# Patient Record
Sex: Male | Born: 1940 | Race: White | Hispanic: No | Marital: Married | Smoking: Current every day smoker
Health system: Southern US, Community
[De-identification: ages and names within clinical notes are randomized; demographics above are authoritative.]

---

## 2010-11-09 ENCOUNTER — Emergency Department (HOSPITAL_COMMUNITY): Payer: Medicare HMO

## 2010-11-09 ENCOUNTER — Emergency Department (HOSPITAL_COMMUNITY)
Admission: EM | Admit: 2010-11-09 | Discharge: 2010-11-09 | Disposition: A | Discharge status: Home / Self Care | Payer: Medicare HMO | Attending: Emergency Medicine | Admitting: Emergency Medicine

## 2010-11-09 ENCOUNTER — Encounter: Payer: Self-pay | Admitting: Emergency Medicine

## 2010-11-09 DIAGNOSIS — F17200 Nicotine dependence, unspecified, uncomplicated: Secondary | ICD-10-CM | POA: Insufficient documentation

## 2010-11-09 DIAGNOSIS — W010XXA Fall on same level from slipping, tripping and stumbling without subsequent striking against object, initial encounter: Secondary | ICD-10-CM | POA: Insufficient documentation

## 2010-11-09 DIAGNOSIS — R52 Pain, unspecified: Secondary | ICD-10-CM | POA: Insufficient documentation

## 2010-11-09 DIAGNOSIS — M25469 Effusion, unspecified knee: Secondary | ICD-10-CM | POA: Insufficient documentation

## 2010-11-09 DIAGNOSIS — M25569 Pain in unspecified knee: Secondary | ICD-10-CM

## 2010-11-09 MED ORDER — IBUPROFEN 800 MG PO TABS: 800.0000 mg | ORAL_TABLET | Freq: Three times a day (TID) | ORAL | Status: AC

## 2010-11-09 MED ORDER — HYDROCODONE-ACETAMINOPHEN 5-325 MG PO TABS: 1.0000 | ORAL_TABLET | ORAL | Status: AC | PRN

## 2010-11-09 MED ORDER — HYDROCODONE-ACETAMINOPHEN 5-325 MG PO TABS
2.0000 | ORAL_TABLET | Freq: Once | ORAL | Status: AC
  Administered 2010-11-09: 2 via ORAL
  Filled 2010-11-09: qty 2

## 2010-11-09 MED ORDER — IBUPROFEN 800 MG PO TABS
800.0000 mg | ORAL_TABLET | Freq: Once | ORAL | Status: AC
  Administered 2010-11-09: 800 mg via ORAL
  Filled 2010-11-09: qty 1

## 2010-11-09 NOTE — ED Provider Notes (Signed)
History     CSN: [id number] Arrival date & time: 11/09/2010  5:28 AM  Chief Complaint  Patient presents with  . Fall  . Knee Pain   HPI Comments: Seen [id number]  Patient is a 70 y.o. male presenting with fall and knee pain. The history is provided by the patient.  Fall The accident occurred less than 1 hour ago. He fell from a height of 3 to 5 ft. 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 The point of impact was the left knee. The pain is present in the left knee. The pain is at a severity of 7/10. The pain is moderate. He was not ambulatory at the scene. There was no entrapment after the fall. There was no drug use involved in the accident. There was no alcohol use involved in the accident. The symptoms are aggravated by standing and ambulation. He has tried nothing for the symptoms.  Knee Pain    History reviewed. No pertinent past medical history.  History reviewed. No pertinent past surgical history.  No family history on file.  History  Substance Use Topics  . Smoking status: Current Everyday Smoker -- 1.0 packs/day    Types: Cigarettes  . Smokeless tobacco: Not on file  . Alcohol Use: No      Review of Systems  All other systems reviewed and are negative.    Physical Exam  BP 122/61  Pulse 73  Temp(Src) 98.5 F (36.9 C) (Oral)  Resp 18  Ht 5\' 8"  (1.727 m)  Wt 170 lb (77.111 kg)  BMI 25.85 kg/m2  SpO2 97%  Physical Exam  Constitutional: He is oriented to person, place, and time. He appears well-developed and well-nourished. He appears distressed.  HENT:  Head: Normocephalic and atraumatic.  Eyes: EOM are normal.  Neck: Normal range of motion. Neck supple.  Cardiovascular: Normal rate, normal heart sounds and intact distal pulses.   Pulmonary/Chest: Effort normal and breath sounds normal.  Abdominal: Soft.  Musculoskeletal:       Left knee with moderate effusion, positive crepitus. ROM with tenderness,  no deformity noted.pulses 2+  Neurological: He is alert and oriented to person, place, and time.  Skin: Skin is warm and dry.    ED Course  Procedures  MDM Patient who fell going down a hill due to rain slick grass. No fracture on xray. Suspect internal derangement of the knee. Placed in knee immobilizer, on crutches. Treated with analgesics. Home with Rx for antiinflammatory and analgesics. Referral to orthopedics for follow up.      Nicoletta Dress. Colon Branch, MD 11/09/10 [phone]

## 2010-11-09 NOTE — ED Notes (Signed)
Patient taught about crutch walking, demonstrated correctly. Knee immobilizer applied, wife and patient verbalized understanding of application. Patient given ice pack and instructed to use it in intervals of 20 minutes with elevation of left leg, patient verbalized understanding.

## 2010-11-09 NOTE — ED Notes (Signed)
Patient states he was walking down a hill and slipped on the grass; c/o left knee pain.

## 2012-04-25 IMAGING — CR DG KNEE COMPLETE 4+V*L*
5 series · 5 of 5 positions shown · non-contrast
Comparison: None.

CLINICAL DATA: Left knee pain after fall

LEFT KNEE - COMPLETE 4+ VIEW

[view not recorded (1 of 5)]
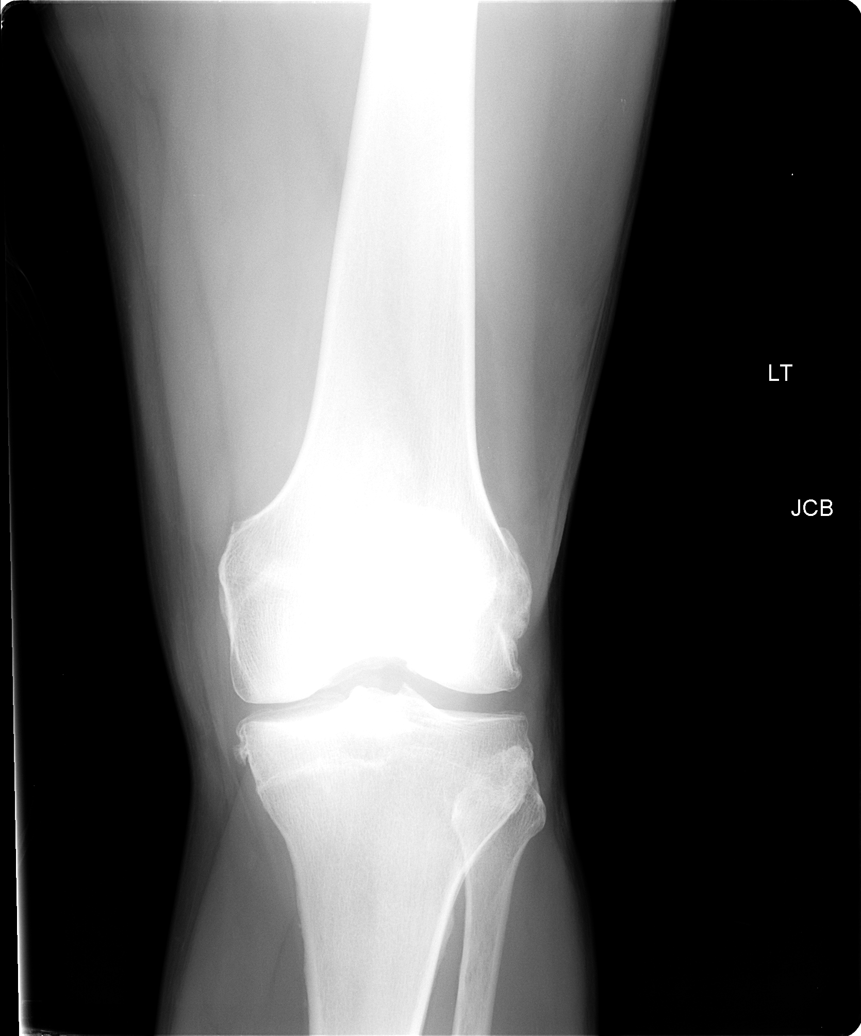

[view not recorded (2 of 5)]
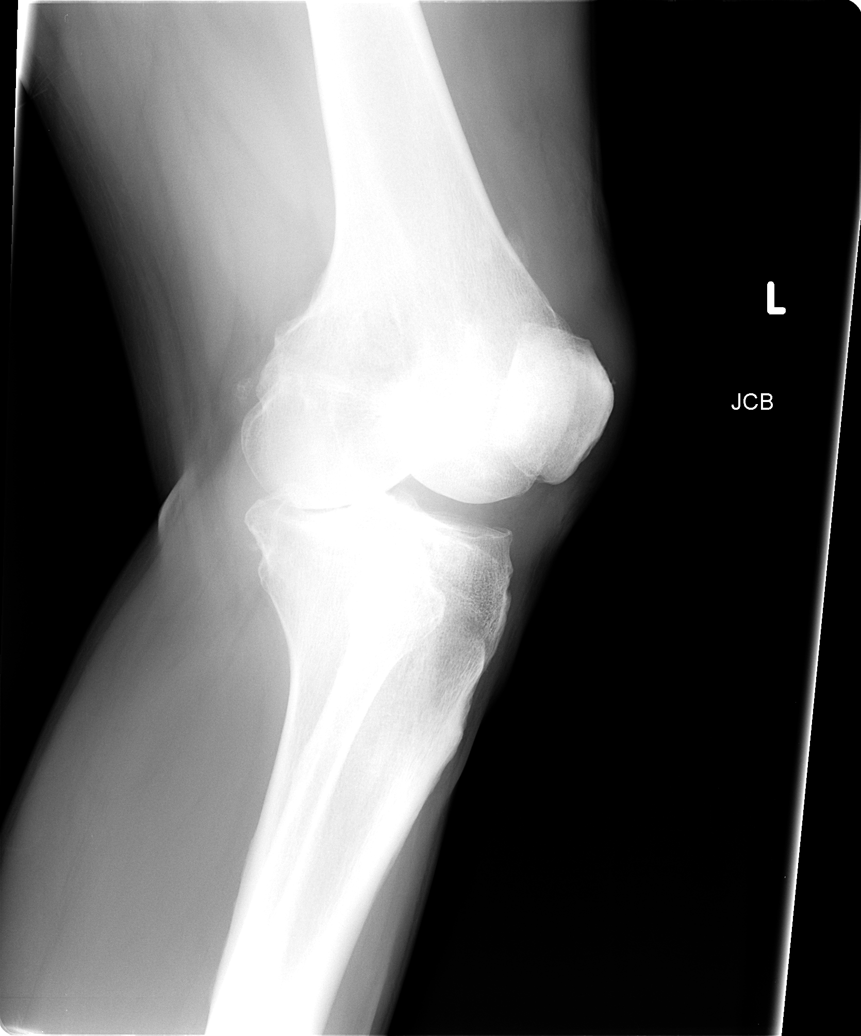

[view not recorded (3 of 5)]
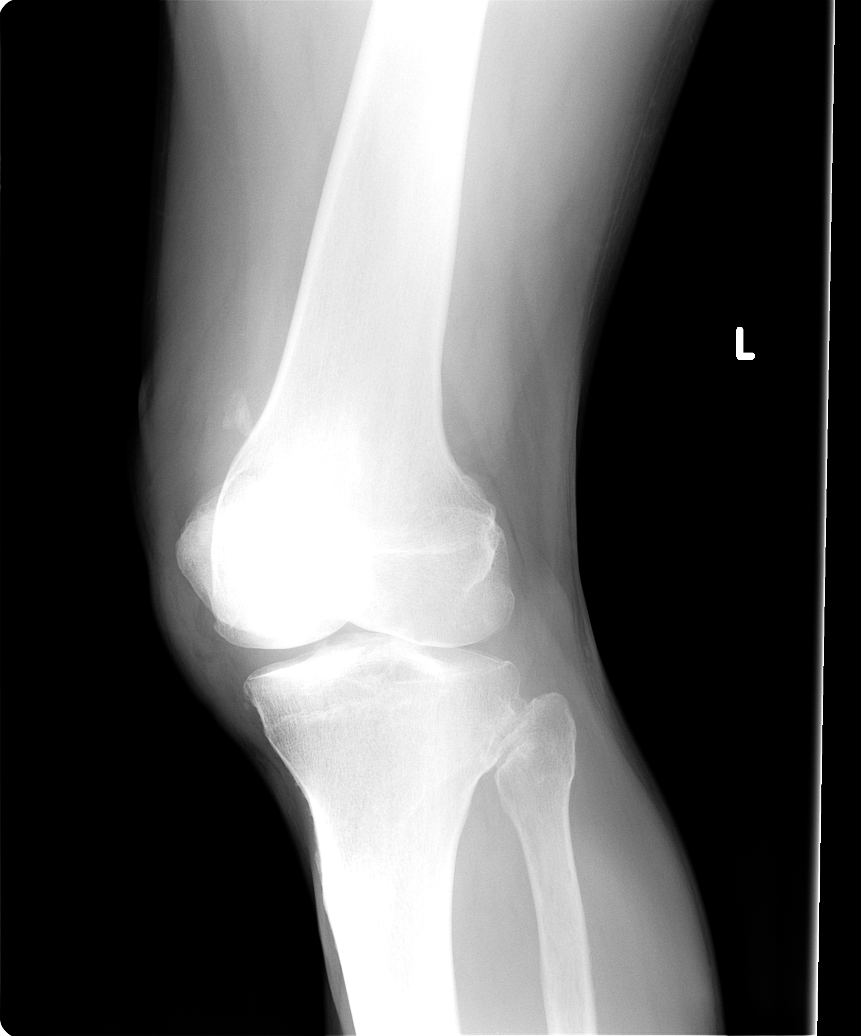

[view not recorded (4 of 5)]
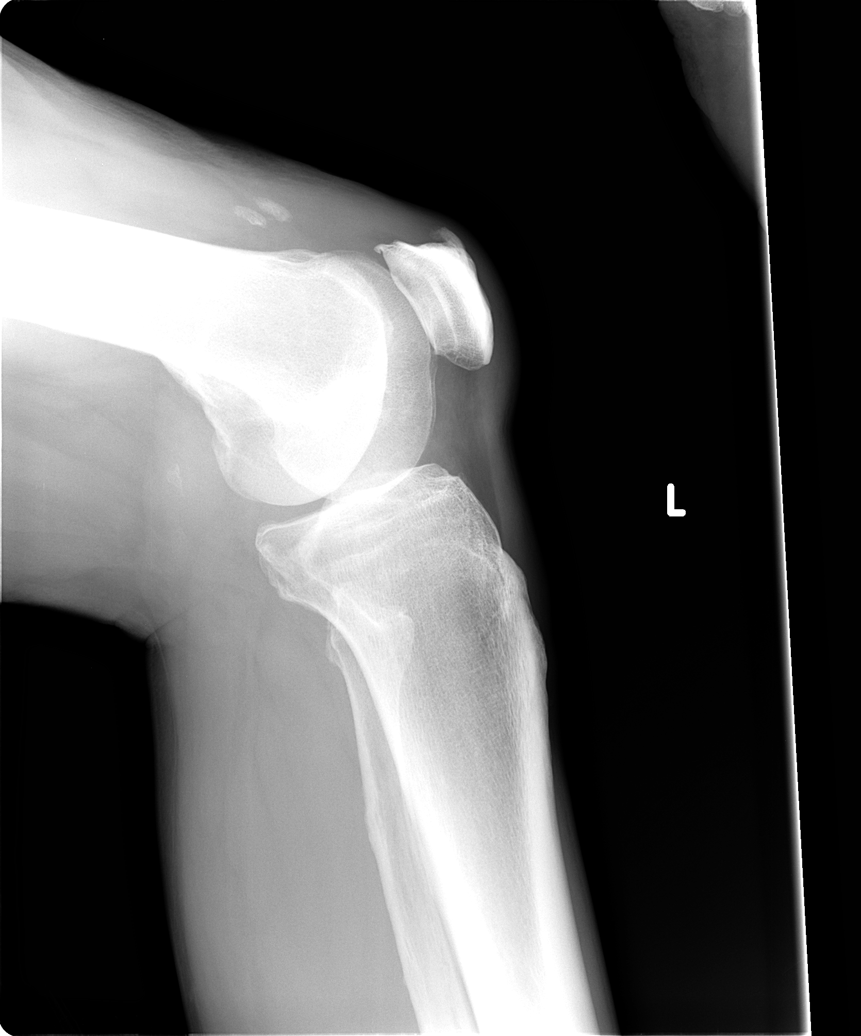

[view not recorded (5 of 5)]
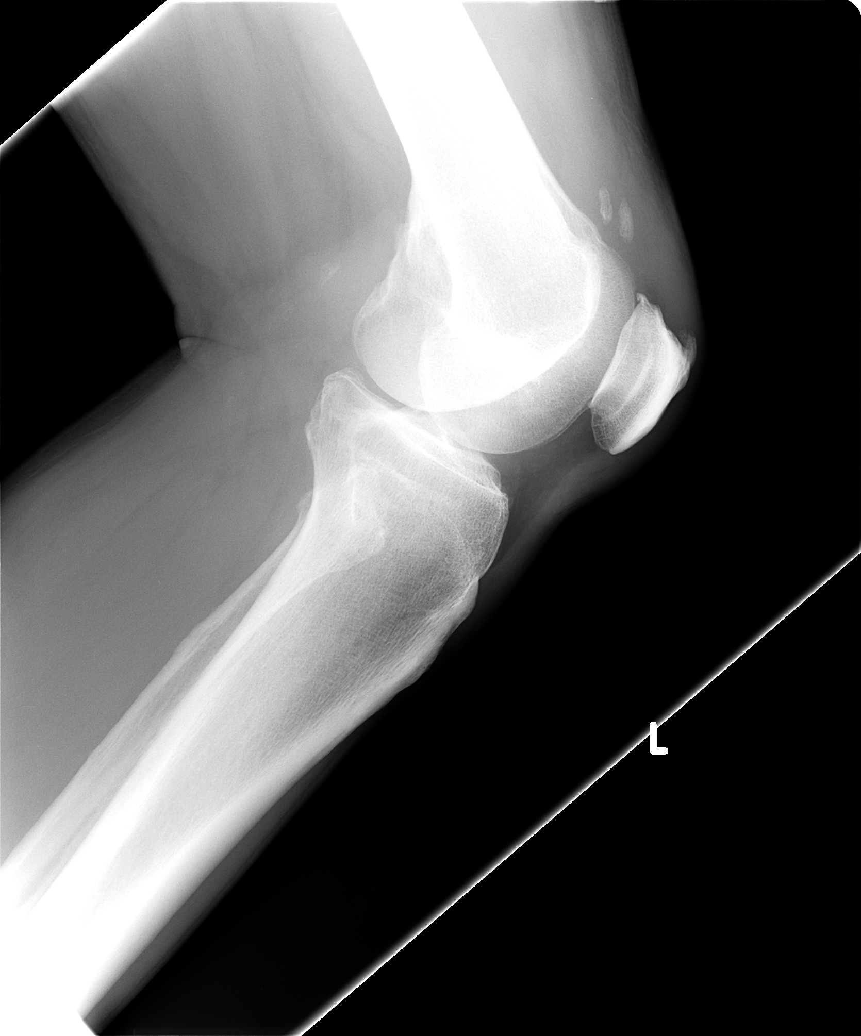

[5 of 5 positions shown; findings below may reference images not displayed]

FINDINGS: Degenerative changes with medial compartment narrowing
and hypertrophic changes in the medial and patellofemoral
compartments.  Calcifications in the suprapatellar spaces may
represent loose bodies or osteochondromatosis.  Probable small
effusion. No evidence of acute fracture or subluxation.  No focal
bone lesions.  Bone matrix and cortex appear intact.  No abnormal
radiopaque densities in the soft tissues.
IMPRESSION: Degenerative changes in the left knee.  Loose bodies versus
osteochondromatosis in the suprapatellar bursa.  No evidence of
acute fracture or subluxation.

## 2020-01-10 ENCOUNTER — Ambulatory Visit: Payer: Medicare HMO | Admitting: Dermatology

## 2020-01-10 ENCOUNTER — Encounter: Payer: Self-pay | Admitting: Dermatology

## 2020-01-10 ENCOUNTER — Other Ambulatory Visit: Payer: Self-pay

## 2020-01-10 DIAGNOSIS — L298 Other pruritus: Secondary | ICD-10-CM | POA: Diagnosis not present

## 2020-01-10 DIAGNOSIS — D692 Other nonthrombocytopenic purpura: Secondary | ICD-10-CM

## 2020-01-10 DIAGNOSIS — D1801 Hemangioma of skin and subcutaneous tissue: Secondary | ICD-10-CM

## 2020-01-10 DIAGNOSIS — Z1283 Encounter for screening for malignant neoplasm of skin: Secondary | ICD-10-CM

## 2020-01-10 DIAGNOSIS — L570 Actinic keratosis: Secondary | ICD-10-CM

## 2020-01-10 NOTE — Patient Instructions (Addendum)
First visit in many years for Michael Anderson date of birth Jan 28, 1941.  General skin check from the waist up showed no atypical moles, melanoma, or nonmobile skin cancer.  On the left lower outer front thigh and on the right forearm are brown subtly textured spots called stucco keratoses which are safe to leave.  On the torso you have a few dozen tiny little red dots called angiomas which require no intervention.  On the left inner buttocks is a roughly 2 inch area where the skin is discolored and thickened.  This represents irritation and pressure rather than a growth or an infection or a skin cancer.  Although currently this is not bothering you, this can lead to a little ulceration or something called a pressure sore.  Whatever you can do to not put pressure on this area when you are sitting for an extended period of time would be helpful.  Daily after bathing you can apply a very thin film of an ointment called Aquaphor for the next 30 days.  This can be gotten over-the-counter in a grocery store or [place] or on [employer].  It has no side effects.  I would appreciate a phone call in a month to tell me whether this has improved the area.  The additional spots I looked at were several skin tags under the arm which are quite harmless.  Finally we discussed the easy bruising he and his brothers have been experiencing on the forearms (no history of any other abnormal bleeding or bruising).  You may look for a nonprescription cream called Dermend and apply this to areas prone to bruise once daily.  Roughly 60% of people who apply this cream believe that they have significantly less bruising, but it is not a true cure.  Follow-up visit can be on an as-needed basis.  After the visit, [name] inform me that he has been plagued for years with extremely dry skin particularly in the winter.  He may look for a nonprescription moisturizer called Curel (if not available at [facility] then he may check on [place]  This works  best after soaking the skin (safe tub bath is more effective than a shower) it takes roughly 3 weeks to see improvement and then you be able to may be able to use this only a couple of times a week.

## 2020-04-03 ENCOUNTER — Encounter: Payer: Self-pay | Admitting: Dermatology

## 2020-04-03 NOTE — Progress Notes (Signed)
   New Patient   Subjective  Michael Anderson is a 80 y.o. male who presents for the following: Skin Problem (left post thigh/buttock- spot/dark- growing).  General skin examination Location:  Duration:  Quality: Wife noticed dark spot on the lower buttocks Associated Signs/Symptoms: Modifying Factors:  Severity:  Timing: Context:    The following portions of the chart were reviewed this encounter and updated as appropriate:  Allergies  Meds  Problems  Med Hx  Surg Hx  Fam Hx      Objective  Well appearing patient in no apparent distress; mood and affect are within normal limits.   A full examination was performed including scalp, head, eyes, ears, nose, lips, neck, chest, axillae, abdomen, back, buttocks, bilateral upper extremities, bilateral lower extremities, hands, feet, fingers, toes, fingernails, and toenails. All findings within normal limits unless otherwise noted below.   Assessment & Plan   First visit in many years for Michael Anderson date of birth 10/18/1940.  General skin check from the waist up showed no atypical moles, melanoma, or nonmobile skin cancer.  On the left lower outer front thigh and on the right forearm are brown subtly textured spots called stucco keratoses which are safe to leave.  On the torso you have a few dozen tiny little red dots called angiomas which require no intervention.  On the left inner buttocks is a roughly 2 inch area where the skin is discolored and thickened.  This represents irritation and pressure rather than a growth or an infection or a skin cancer.  Although currently this is not bothering you, this can lead to a little ulceration or something called a pressure sore.  Whatever you can do to not put pressure on this area when you are sitting for an extended period of time would be helpful.  Daily after bathing you can apply a very thin film of an ointment called Aquaphor for the next 30 days.  This can be gotten over-the-counter in a  grocery store or [employer] or on [employer].  It has no side effects.  I would appreciate a phone call in a month to tell me whether this has improved the area.  The additional spots I looked at were several skin tags under the arm which are quite harmless.  Finally we discussed the easy bruising he and his brothers have been experiencing on the forearms (no history of any other abnormal bleeding or bruising).  You may look for a nonprescription cream called Dermend and apply this to areas prone to bruise once daily.  Roughly 60% of people who apply this cream believe that they have significantly less bruising, but it is not a true cure.  Follow-up visit can be on an as-needed basis.  After the visit, [name] inform me that he has been plagued for years with extremely dry skin particularly in the winter.  He may look for a nonprescription moisturizer called Curel (if not available at [facility] then he may check on [place]).  This works best after soaking the skin (safe tub bath is more effective than a shower) it takes roughly 3 weeks to see improvement and then you be able to may be able to use this only a couple of times a week.I, Janalyn Harder, MD, have reviewed all documentation for this visit. The documentation on 04/03/20 for the exam, diagnosis, procedures, and orders are all accurate and complete.
